# Patient Record
Sex: Female | Born: 1960 | Race: Black or African American | Hispanic: No | Smoking: Current every day smoker
Health system: Southern US, Community
[De-identification: ages and names within clinical notes are randomized; demographics above are authoritative.]

## PROBLEM LIST (undated history)

## (undated) DIAGNOSIS — F419 Anxiety disorder, unspecified: Secondary | ICD-10-CM

---

## 2001-03-07 ENCOUNTER — Other Ambulatory Visit: Admission: RE | Admit: 2001-03-07 | Discharge: 2001-03-07 | Payer: Self-pay | Admitting: *Deleted

## 2009-04-12 ENCOUNTER — Emergency Department (HOSPITAL_COMMUNITY): Admission: EM | Admit: 2009-04-12 | Discharge: 2009-04-12 | Payer: Self-pay | Admitting: Emergency Medicine

## 2009-07-09 ENCOUNTER — Ambulatory Visit (HOSPITAL_COMMUNITY): Admission: RE | Admit: 2009-07-09 | Discharge: 2009-07-09 | Payer: Self-pay | Admitting: Family Medicine

## 2010-05-19 ENCOUNTER — Ambulatory Visit (HOSPITAL_COMMUNITY): Admission: RE | Admit: 2010-05-19 | Discharge: 2010-05-19 | Payer: Self-pay | Admitting: Internal Medicine

## 2010-12-18 LAB — URINALYSIS, ROUTINE W REFLEX MICROSCOPIC
Bilirubin Urine: NEGATIVE
Glucose, UA: NEGATIVE mg/dL
Hgb urine dipstick: NEGATIVE
Ketones, ur: NEGATIVE mg/dL
Leukocytes, UA: NEGATIVE
Nitrite: POSITIVE — AB
Protein, ur: NEGATIVE mg/dL
Specific Gravity, Urine: 1.02 (ref 1.005–1.030)
Urobilinogen, UA: 0.2 mg/dL (ref 0.0–1.0)
pH: 5.5 (ref 5.0–8.0)

## 2010-12-18 LAB — URINE MICROSCOPIC-ADD ON

## 2010-12-18 LAB — PREGNANCY, URINE: Preg Test, Ur: NEGATIVE

## 2010-12-18 LAB — GLUCOSE, CAPILLARY

## 2012-07-26 ENCOUNTER — Other Ambulatory Visit (HOSPITAL_COMMUNITY): Payer: Self-pay | Admitting: Internal Medicine

## 2012-07-26 DIAGNOSIS — Z139 Encounter for screening, unspecified: Secondary | ICD-10-CM

## 2012-07-29 ENCOUNTER — Ambulatory Visit (HOSPITAL_COMMUNITY): Payer: Self-pay

## 2012-07-30 ENCOUNTER — Ambulatory Visit (HOSPITAL_COMMUNITY)
Admission: RE | Admit: 2012-07-30 | Discharge: 2012-07-30 | Disposition: A | Discharge status: Home / Self Care | Payer: 59 | Source: Ambulatory Visit | Attending: Internal Medicine | Admitting: Internal Medicine

## 2012-07-30 DIAGNOSIS — Z1231 Encounter for screening mammogram for malignant neoplasm of breast: Secondary | ICD-10-CM | POA: Insufficient documentation

## 2012-07-30 DIAGNOSIS — Z139 Encounter for screening, unspecified: Secondary | ICD-10-CM

## 2013-04-05 ENCOUNTER — Encounter (HOSPITAL_COMMUNITY): Payer: Self-pay | Admitting: *Deleted

## 2013-04-05 ENCOUNTER — Emergency Department (HOSPITAL_COMMUNITY): Payer: Self-pay

## 2013-04-05 ENCOUNTER — Emergency Department (HOSPITAL_COMMUNITY)
Admission: EM | Admit: 2013-04-05 | Discharge: 2013-04-05 | Disposition: A | Discharge status: Home / Self Care | Payer: Self-pay | Attending: Emergency Medicine | Admitting: Emergency Medicine

## 2013-04-05 DIAGNOSIS — F17200 Nicotine dependence, unspecified, uncomplicated: Secondary | ICD-10-CM | POA: Insufficient documentation

## 2013-04-05 DIAGNOSIS — Z8659 Personal history of other mental and behavioral disorders: Secondary | ICD-10-CM | POA: Insufficient documentation

## 2013-04-05 DIAGNOSIS — R0789 Other chest pain: Secondary | ICD-10-CM | POA: Insufficient documentation

## 2013-04-05 DIAGNOSIS — R450 Nervousness: Secondary | ICD-10-CM | POA: Insufficient documentation

## 2013-04-05 HISTORY — DX: Anxiety disorder, unspecified: F41.9

## 2013-04-05 LAB — URINALYSIS, ROUTINE W REFLEX MICROSCOPIC
Bilirubin Urine: NEGATIVE
Glucose, UA: NEGATIVE mg/dL
Protein, ur: NEGATIVE mg/dL
Specific Gravity, Urine: 1.01 (ref 1.005–1.030)
Urobilinogen, UA: 0.2 mg/dL (ref 0.0–1.0)
pH: 6.5 (ref 5.0–8.0)

## 2013-04-05 LAB — URINE MICROSCOPIC-ADD ON

## 2013-04-05 LAB — CBC WITH DIFFERENTIAL/PLATELET
Basophils Relative: 0 % (ref 0–1)
Eosinophils Relative: 1 % (ref 0–5)
Lymphocytes Relative: 42 % (ref 12–46)
MCH: 30.4 pg (ref 26.0–34.0)
MCHC: 33.6 g/dL (ref 30.0–36.0)
Neutrophils Relative %: 49 % (ref 43–77)
RBC: 4.47 MIL/uL (ref 3.87–5.11)

## 2013-04-05 LAB — D-DIMER, QUANTITATIVE: D-Dimer, Quant: <0.27 ug/mL-FEU (ref 0.00–0.48)

## 2013-04-05 LAB — BASIC METABOLIC PANEL
Calcium: 10 mg/dL (ref 8.4–10.5)
Chloride: 103 mEq/L (ref 96–112)
Glucose, Bld: 104 mg/dL — ABNORMAL HIGH (ref 70–99)
Sodium: 140 mEq/L (ref 135–145)

## 2013-04-05 LAB — TROPONIN I: Troponin I: <0.3 ng/mL (ref <0.30)

## 2013-04-05 LAB — LIPASE, BLOOD: Lipase: 22 U/L (ref 11–59)

## 2013-04-05 MED ORDER — ASPIRIN 81 MG PO CHEW
324.0000 mg | CHEWABLE_TABLET | Freq: Once | ORAL | Status: AC
  Administered 2013-04-05: 324 mg via ORAL
  Filled 2013-04-05: qty 4

## 2013-04-05 NOTE — ED Notes (Signed)
Pt c/o mid center chest pain and dizziness that started when she went to stand up, states that she thought that it may have been an anxiety attack but sat back down , took some deep breathes and the pain remained, denies any n/v. Sob,.

## 2013-04-05 NOTE — ED Provider Notes (Signed)
Medical screening examination/treatment/procedure(s) were performed by non-physician practitioner and as supervising physician I was immediately available for consultation/collaboration. Emila Steinhauser, MD, FACEP   Meng Winterton L Ankith Edmonston, MD 04/05/13 2339 

## 2013-04-05 NOTE — ED Provider Notes (Signed)
CSN: [id number]     Arrival date & time 04/05/13  1740 History     First MD Initiated Contact with Patient 04/05/13 1807     Chief Complaint  Patient presents with  . Chest Pain   (Consider location/radiation/quality/duration/timing/severity/associated sxs/prior Treatment) HPI Comments: Patient is a 52 year old African American female who presents to the emergency department with a complaint of heaviness and fullness in the anterior chest. The patient states that during this evening she had been" rolling up and preparing her hair", she got up from her sitting position to go into another bromide she was stopped in her tracks because of a for cessation in her chest. This was accompanied by a sensation of her heart beating very fast and dizziness with a sensation of the room spinning. The patient is not sure about being short of breath. She states she did not lose consciousness. She took some deep breaths and the pain got a little better but did not go completely away. Patient states about time she arrived at the emergency department however the pain has nearly all gone. It is of note that the patient has had problems with anxiety in the past, the patient became concerned because she states that when she sat down for a few minutes the anxiousness did not go away.  It is also of note that the patient is a smoker. She uses alcohol occasionally. She's not had any injury or trauma to her chest. She's never been treated diagnosed with any blood clots in the legs or lungs. She's not had any injury or operations to the chest wall.  The history is provided by the patient.    Past Medical History  Diagnosis Date  . Anxiety    History reviewed. No pertinent past surgical history. No family history on file. History  Substance Use Topics  . Smoking status: Current Every Day Smoker  . Smokeless tobacco: Not on file  . Alcohol Use: Not on file   OB History   Grav Para Term Preterm Abortions TAB SAB Ect  Mult Living                 Review of Systems  Constitutional: Negative for activity change.       All ROS Neg except as noted in HPI  HENT: Negative for nosebleeds and neck pain.   Eyes: Negative for photophobia and discharge.  Respiratory: Negative for cough, shortness of breath and wheezing.   Cardiovascular: Negative for chest pain and palpitations.  Gastrointestinal: Negative for abdominal pain and blood in stool.  Genitourinary: Negative for dysuria, frequency and hematuria.  Musculoskeletal: Negative for back pain and arthralgias.  Skin: Negative.   Neurological: Negative for dizziness, seizures and speech difficulty.  Psychiatric/Behavioral: Negative for hallucinations and confusion. The patient is nervous/anxious.     Allergies  Review of patient's allergies indicates no known allergies.  Home Medications  No current outpatient prescriptions on file. BP 130/82  Pulse 90  Temp(Src) 98.3 F (36.8 C)  Resp 16  Ht 5\' 7"  (1.702 m)  Wt 175 lb (79.379 kg)  BMI 27.4 kg/m2  SpO2 100% Physical Exam  Nursing note and vitals reviewed. Constitutional: She is oriented to person, place, and time. She appears well-developed and well-nourished.  Non-toxic appearance.  HENT:  Head: Normocephalic.  Right Ear: Tympanic membrane and external ear normal.  Left Ear: Tympanic membrane and external ear normal.  Eyes: EOM and lids are normal. Pupils are equal, round, and reactive to light.  Neck:  Normal range of motion. Neck supple. Carotid bruit is not present.  Cardiovascular: Normal rate, regular rhythm, normal heart sounds, intact distal pulses and normal pulses.   Pulmonary/Chest: Breath sounds normal. No respiratory distress.  Abdominal: Soft. Bowel sounds are normal. There is no tenderness. There is no guarding.  Musculoskeletal: Normal range of motion.  Lymphadenopathy:       Head (right side): No submandibular adenopathy present.       Head (left side): No submandibular  adenopathy present.    She has no cervical adenopathy.  Neurological: She is alert and oriented to person, place, and time. She has normal strength. No cranial nerve deficit or sensory deficit.  Skin: Skin is warm and dry.  Psychiatric: She has a normal mood and affect. Her speech is normal.    ED Course   Procedures (including critical care time) Pulse oximetry 100% on room. Within normal limits by my interpretation. Labs Reviewed - No data to display No results found. No diagnosis found.  Date: 04/05/2013  Rate: *81**  Rhythm: normal sinus rhythm  QRS Axis: normal  Intervals: normal  ST/T Wave abnormalities: normal  Conduction Disutrbances:none  Narrative Interpretation: No STEMI  Old EKG Reviewed: none available  MDM  *I have reviewed nursing notes, vital signs, and all appropriate lab and imaging results for this patient.** Pt presents to ED with a full sensation and heaviness in  The anterior chest.  Pain last about 30 min, and was accompanied by dizziness (head spinning). Case reviewed with Dr. Lynelle Doctor.  CBC wnl. D-dimer wnl at <0.27.  Chest xray negative for acute problems. Troponin I neg. Times 2. No rhythm changes on monitor. Pt denies any heaviness at discharge. She is eating without problem. Ambulatory to BR without problem.  Pt will see her PCP next week for completion of the workup. She will return to the ED if any changes before her office appointment.   Kathie Dike, PA-C 04/05/13 2143

## 2013-04-05 NOTE — ED Notes (Signed)
Discharge instructions reviewed with pt, questions answered. Pt verbalized understanding.  

## 2013-04-05 NOTE — ED Notes (Signed)
Pt updated on wait status, labs reviewed, pt given crackers and ginger ale per PA

## 2013-04-08 LAB — URINE CULTURE

## 2013-04-11 ENCOUNTER — Telehealth (HOSPITAL_COMMUNITY): Payer: Self-pay | Admitting: *Deleted

## 2013-04-11 NOTE — ED Notes (Signed)
Post ED Visit - Positive Culture Follow-up: Successful Patient Follow-Up  Culture assessed and recommendations reviewed by: []  Wes Dulaney, Pharm.D., BCPS [x]  Celedonio Miyamoto, Pharm.D., BCPS []  Georgina Pillion, Pharm.D., BCPS []  [place], [address].D., BCPS, AAHIVP []  Estella Husk, Pharm.D., BCPS, AAHIVP  Positive urine culture  []  Patient discharged without antimicrobial prescription and treatment is now indicated [x]  Organism is resistant to prescribed ED discharge antimicrobial []  Patient with positive blood cultures  Changes discussed with ED provider:Lisa Allyne Gee New antibiotic prescription  Bactrim po BID x 3 days   Contacted patient: Left voice mail message, date 04/11/2013 [id number]  Larena Sox 04/11/2013, 6:20 PM

## 2013-04-12 ENCOUNTER — Telehealth (HOSPITAL_COMMUNITY): Payer: Self-pay | Admitting: Emergency Medicine

## 2013-04-13 ENCOUNTER — Telehealth (HOSPITAL_COMMUNITY): Payer: Self-pay | Admitting: Emergency Medicine

## 2013-04-13 NOTE — ED Notes (Signed)
Unable to contact patient via phone. Sent letter. °

## 2015-01-04 IMAGING — CR DG CHEST 1V PORT
1 series · 1 of 1 positions shown · non-contrast
Comparison: 07/09/2009

CLINICAL DATA: Shortness of breath, mid chest pain

PORTABLE CHEST - 1 VIEW

[portable]
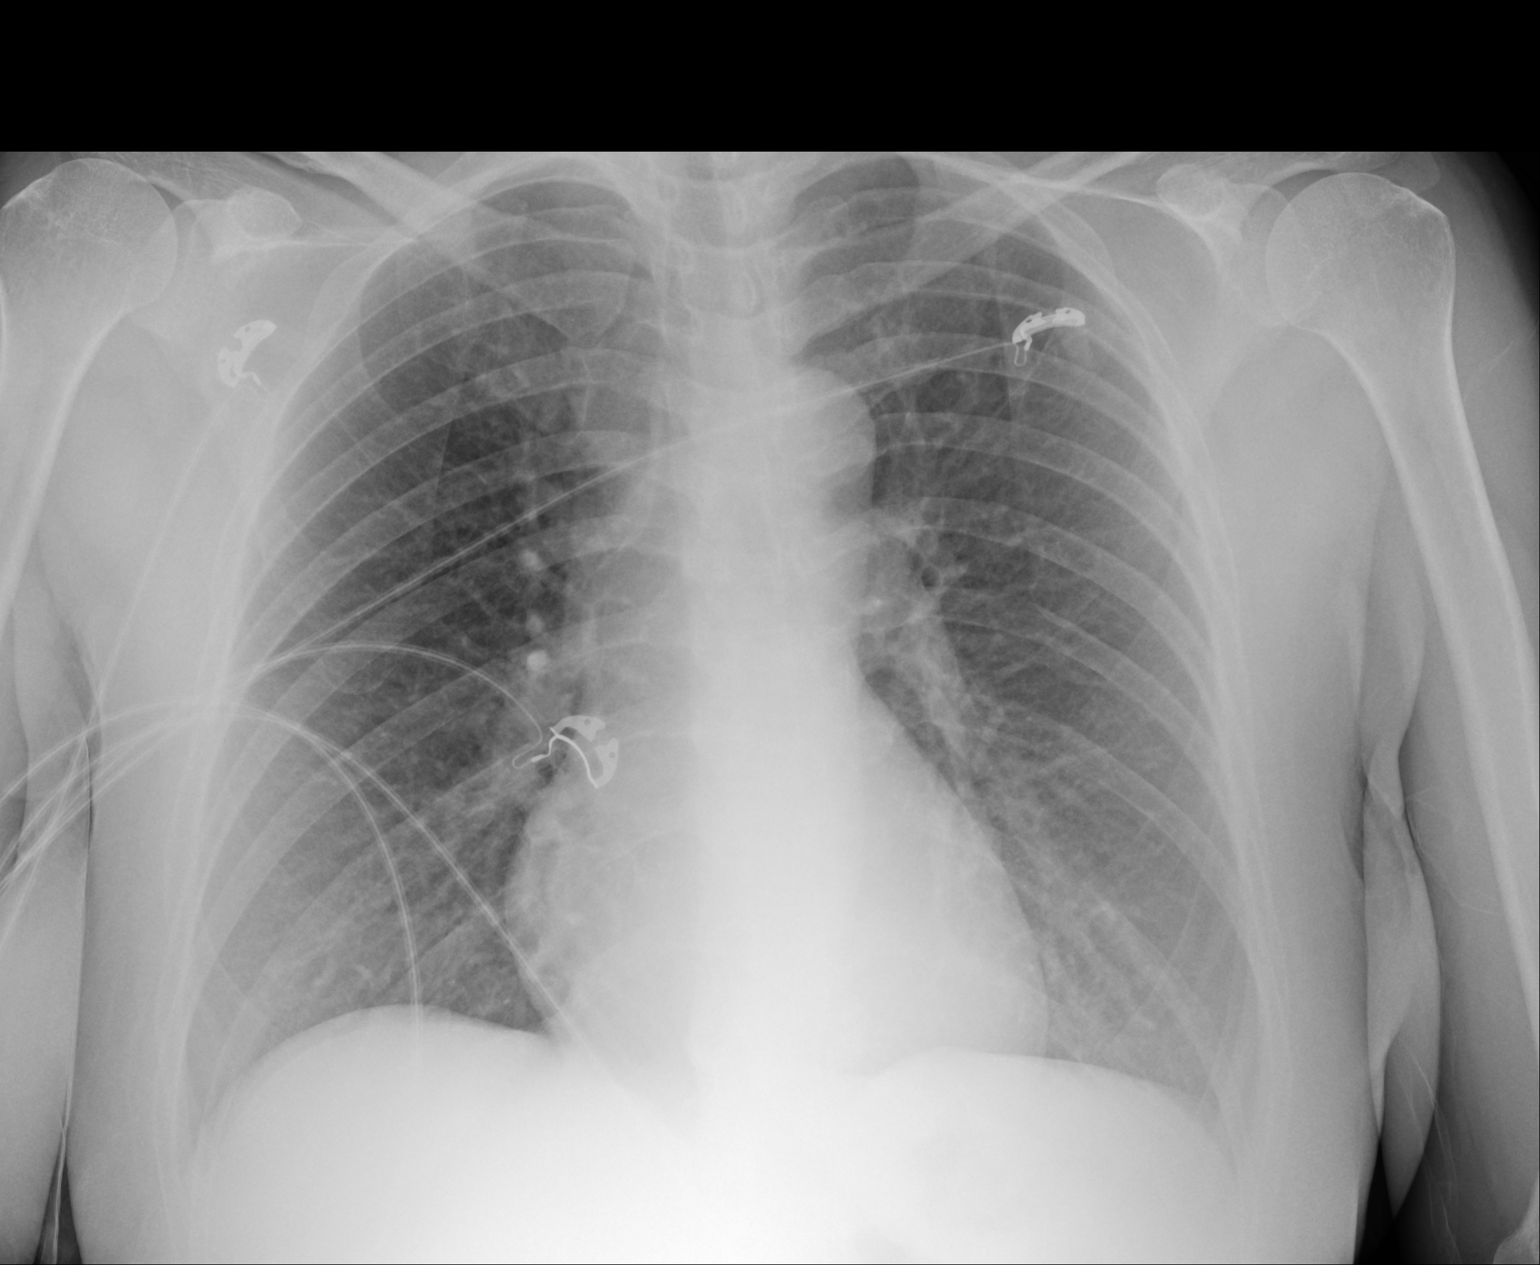

[1 of 1 positions shown; findings below may reference images not displayed]

FINDINGS: Lungs are clear. No pleural effusion or pneumothorax.

Cardiomediastinal silhouette is within normal limits.
IMPRESSION: No evidence of acute cardiopulmonary disease.

## 2021-09-19 ENCOUNTER — Encounter (INDEPENDENT_AMBULATORY_CARE_PROVIDER_SITE_OTHER): Payer: Self-pay | Admitting: *Deleted

## 2021-10-13 ENCOUNTER — Telehealth (INDEPENDENT_AMBULATORY_CARE_PROVIDER_SITE_OTHER): Payer: Self-pay | Admitting: *Deleted

## 2021-10-13 ENCOUNTER — Other Ambulatory Visit (INDEPENDENT_AMBULATORY_CARE_PROVIDER_SITE_OTHER): Payer: Self-pay

## 2021-10-13 ENCOUNTER — Encounter (INDEPENDENT_AMBULATORY_CARE_PROVIDER_SITE_OTHER): Payer: Self-pay | Admitting: *Deleted

## 2021-10-13 DIAGNOSIS — Z1211 Encounter for screening for malignant neoplasm of colon: Secondary | ICD-10-CM

## 2021-10-13 MED ORDER — PEG 3350-KCL-NA BICARB-NACL 420 G PO SOLR: 4000.0000 mL | Freq: Once | ORAL | 0 refills | Status: AC

## 2021-10-13 NOTE — Telephone Encounter (Signed)
Patient needs trilyte 

## 2021-10-13 NOTE — Telephone Encounter (Signed)
Referring MD/PCP: millsaps  Procedure: tcs  Reason/Indication:  screening  Has patient had this procedure before?  no  If so, when, by whom and where?    Is there a family history of colon cancer?  no  Who?  What age when diagnosed?    Is patient diabetic? If yes, Type 1 or Type 2   no      Does patient have prosthetic heart valve or mechanical valve?  no  Do you have a pacemaker/defibrillator?  no  Has patient ever had endocarditis/atrial fibrillation? no  Does patient use oxygen? no  Has patient had joint replacement within last 12 months?  no  Is patient constipated or do they take laxatives? no  Does patient have a history of alcohol/drug use?  no  Have you had a stroke/heart attack last 6 mths? no  Do you take medicine for weight loss?  no  For female patients,: have you had a hysterectomy                       are you post menopausal                       do you still have your menstrual cycle no  Is patient on blood thinner such as Coumadin, Plavix and/or Aspirin? yes  Medications: asa 81 mg daily  Allergies: bactrim  Medication Adjustment per Dr Rehman/Dr Levon Hedger asa 2 days  Procedure date & time: 11/09/21

## 2021-11-03 NOTE — Patient Instructions (Signed)
Autumn Norris  11/03/2021     @PREFPERIOPPHARMACY @   Your procedure is scheduled on  11/09/2021.   Report to 01/09/2022 at  0830  A.M.   Call this number if you have problems the morning of surgery:  [phone]   Remember:  Follow the diet and prep instructions given to you by the office.    Take these medicines the morning of surgery with A SIP OF WATER                                           None     Do not wear jewelry, make-up or nail polish.  Do not wear lotions, powders, or perfumes, or deodorant.  Do not shave 48 hours prior to surgery.  Men may shave face and neck.  Do not bring valuables to the hospital.  [facility] is not responsible for any belongings or valuables.  Contacts, dentures or bridgework may not be worn into surgery.  Leave your suitcase in the car.  After surgery it may be brought to your room.  For patients admitted to the hospital, discharge time will be determined by your treatment team.  Patients discharged the day of surgery will not be allowed to drive home and must have someone with them for 24 hours.    Special instructions:   DO NOT smoke tobacco or vape for 24 hours before your procedure.  Please read over the following fact sheets that you were given. Anesthesia Post-op Instructions and Care and Recovery After Surgery      Colonoscopy, Adult, Care After This sheet gives you information about how to care for yourself after your procedure. Your health care provider may also give you more specific instructions. If you have problems or questions, contact your health care provider. What can I expect after the procedure? After the procedure, it is common to have: A small amount of blood in your stool for 24 hours after the procedure. Some gas. Mild cramping or bloating of your abdomen. Follow these instructions at home: Eating and drinking  Drink enough fluid to keep your urine pale yellow. Follow instructions from your  health care provider about eating or drinking restrictions. Resume your normal diet as instructed by your health care provider. Avoid heavy or fried foods that are hard to digest. Activity Rest as told by your health care provider. Avoid sitting for a long time without moving. Get up to take short walks every 1-2 hours. This is important to improve blood flow and breathing. Ask for help if you feel weak or unsteady. Return to your normal activities as told by your health care provider. Ask your health care provider what activities are safe for you. Managing cramping and bloating  Try walking around when you have cramps or feel bloated. Apply heat to your abdomen as told by your health care provider. Use the heat source that your health care provider recommends, such as a moist heat pack or a heating pad. Place a towel between your skin and the heat source. Leave the heat on for 20-30 minutes. Remove the heat if your skin turns bright red. This is especially important if you are unable to feel pain, heat, or cold. You may have a greater risk of getting burned. General instructions If you were given a sedative during the procedure, it can  affect you for several hours. Do not drive or operate machinery until your health care provider says that it is safe. For the first 24 hours after the procedure: Do not sign important documents. Do not drink alcohol. Do your regular daily activities at a slower pace than normal. Eat soft foods that are easy to digest. Take over-the-counter and prescription medicines only as told by your health care provider. Keep all follow-up visits as told by your health care provider. This is important. Contact a health care provider if: You have blood in your stool 2-3 days after the procedure. Get help right away if you have: More than a small spotting of blood in your stool. Large blood clots in your stool. Swelling of your abdomen. Nausea or vomiting. A  fever. Increasing pain in your abdomen that is not relieved with medicine. Summary After the procedure, it is common to have a small amount of blood in your stool. You may also have mild cramping and bloating of your abdomen. If you were given a sedative during the procedure, it can affect you for several hours. Do not drive or operate machinery until your health care provider says that it is safe. Get help right away if you have a lot of blood in your stool, nausea or vomiting, a fever, or increased pain in your abdomen. This information is not intended to replace advice given to you by your health care provider. Make sure you discuss any questions you have with your health care provider. Document Revised: 07/04/2019 Document Reviewed: 03/24/2019 Elsevier Patient Education  [place] After This sheet gives you information about how to care for yourself after your procedure. Your health care provider may also give you more specific instructions. If you have problems or questions, contact your health care provider. What can I expect after the procedure? After the procedure, it is common to have: Tiredness. Forgetfulness about what happened after the procedure. Impaired judgment for important decisions. Nausea or vomiting. Some difficulty with balance. Follow these instructions at home: For the time period you were told by your health care provider:   Rest as needed. Do not participate in activities where you could fall or become injured. Do not drive or use machinery. Do not drink alcohol. Do not take sleeping pills or medicines that cause drowsiness. Do not make important decisions or sign legal documents. Do not take care of children on your own. Eating and drinking Follow the diet that is recommended by your health care provider. Drink enough fluid to keep your urine pale yellow. If you vomit: Drink water, juice, or soup when you can drink  without vomiting. Make sure you have little or no nausea before eating solid foods. General instructions Have a responsible adult stay with you for the time you are told. It is important to have someone help care for you until you are awake and alert. Take over-the-counter and prescription medicines only as told by your health care provider. If you have sleep apnea, surgery and certain medicines can increase your risk for breathing problems. Follow instructions from your health care provider about wearing your sleep device: Anytime you are sleeping, including during daytime naps. While taking prescription pain medicines, sleeping medicines, or medicines that make you drowsy. Avoid smoking. Keep all follow-up visits as told by your health care provider. This is important. Contact a health care provider if: You keep feeling nauseous or you keep vomiting. You feel light-headed. You are still sleepy  or having trouble with balance after 24 hours. You develop a rash. You have a fever. You have redness or swelling around the IV site. Get help right away if: You have trouble breathing. You have new-onset confusion at home. Summary For several hours after your procedure, you may feel tired. You may also be forgetful and have poor judgment. Have a responsible adult stay with you for the time you are told. It is important to have someone help care for you until you are awake and alert. Rest as told. Do not drive or operate machinery. Do not drink alcohol or take sleeping pills. Get help right away if you have trouble breathing, or if you suddenly become confused. This information is not intended to replace advice given to you by your health care provider. Make sure you discuss any questions you have with your health care provider. Document Revised: 05/13/2020 Document Reviewed: 07/31/2019 Elsevier Patient Education  2022 [employer].

## 2021-11-04 ENCOUNTER — Encounter (HOSPITAL_COMMUNITY)
Admission: RE | Admit: 2021-11-04 | Discharge: 2021-11-04 | Disposition: A | Discharge status: Home / Self Care | Payer: No Typology Code available for payment source | Source: Ambulatory Visit | Attending: Internal Medicine | Admitting: Internal Medicine

## 2021-11-04 ENCOUNTER — Encounter (HOSPITAL_COMMUNITY): Payer: Self-pay

## 2021-11-09 ENCOUNTER — Encounter (INDEPENDENT_AMBULATORY_CARE_PROVIDER_SITE_OTHER): Payer: Self-pay | Admitting: *Deleted

## 2021-11-09 ENCOUNTER — Ambulatory Visit (HOSPITAL_COMMUNITY)
Admission: RE | Admit: 2021-11-09 | Discharge: 2021-11-09 | Disposition: A | Discharge status: Home / Self Care | Payer: No Typology Code available for payment source | Attending: Internal Medicine | Admitting: Internal Medicine

## 2021-11-09 ENCOUNTER — Encounter (HOSPITAL_COMMUNITY)
Admission: RE | Disposition: A | Discharge status: Home / Self Care | Payer: Self-pay | Source: Home / Self Care | Attending: Internal Medicine

## 2021-11-09 ENCOUNTER — Ambulatory Visit (HOSPITAL_COMMUNITY): Payer: No Typology Code available for payment source | Admitting: Anesthesiology

## 2021-11-09 ENCOUNTER — Ambulatory Visit (HOSPITAL_BASED_OUTPATIENT_CLINIC_OR_DEPARTMENT_OTHER): Payer: No Typology Code available for payment source | Admitting: Anesthesiology

## 2021-11-09 DIAGNOSIS — K5730 Diverticulosis of large intestine without perforation or abscess without bleeding: Secondary | ICD-10-CM

## 2021-11-09 DIAGNOSIS — K648 Other hemorrhoids: Secondary | ICD-10-CM

## 2021-11-09 DIAGNOSIS — K644 Residual hemorrhoidal skin tags: Secondary | ICD-10-CM | POA: Insufficient documentation

## 2021-11-09 DIAGNOSIS — F17200 Nicotine dependence, unspecified, uncomplicated: Secondary | ICD-10-CM | POA: Insufficient documentation

## 2021-11-09 DIAGNOSIS — Z1211 Encounter for screening for malignant neoplasm of colon: Secondary | ICD-10-CM | POA: Insufficient documentation

## 2021-11-09 HISTORY — PX: COLONOSCOPY WITH PROPOFOL: SHX5780

## 2021-11-09 LAB — HM COLONOSCOPY

## 2021-11-09 SURGERY — COLONOSCOPY WITH PROPOFOL
Anesthesia: General

## 2021-11-09 MED ORDER — PROPOFOL 500 MG/50ML IV EMUL
INTRAVENOUS | Status: DC | PRN
  Administered 2021-11-09: 150 ug/kg/min via INTRAVENOUS

## 2021-11-09 MED ORDER — PROPOFOL 10 MG/ML IV BOLUS
INTRAVENOUS | Status: DC | PRN
  Administered 2021-11-09: 100 mg via INTRAVENOUS

## 2021-11-09 MED ORDER — LIDOCAINE HCL (CARDIAC) PF 50 MG/5ML IV SOSY
PREFILLED_SYRINGE | INTRAVENOUS | Status: DC | PRN
  Administered 2021-11-09: 50 mg via INTRAVENOUS

## 2021-11-09 MED ORDER — METAMUCIL SMOOTH TEXTURE 58.6 % PO POWD: 1.0000 | Freq: Every day | ORAL | Status: AC

## 2021-11-09 MED ORDER — LACTATED RINGERS IV SOLN: INTRAVENOUS | Status: DC

## 2021-11-09 NOTE — Op Note (Signed)
[facility] ?Patient Name: Autumn Norris ?Procedure Date: 11/09/2021 9:14 AM ?MRN: [id number] ?Date of Birth: [birth year] ?Attending MD: Lionel December , MD ?CSN: [id number] ?Age: 61 ?Admit Type: Outpatient ?Procedure:                Colonoscopy ?Indications:              Screening for colorectal malignant neoplasm ?Providers:                Lionel December, MD, Crystal Page, Durwin Glaze  ?                          Tech, Technician ?Referring MD:             Joelene Millin. Millsaps, NP ?Medicines:                Propofol per Anesthesia ?Complications:            No immediate complications. ?Estimated Blood Loss:     Estimated blood loss: none. ?Procedure:                Pre-Anesthesia Assessment: ?                          - Prior to the procedure, a History and Physical  ?                          was performed, and patient medications and  ?                          allergies were reviewed. The patient's tolerance of  ?                          previous anesthesia was also reviewed. The risks  ?                          and benefits of the procedure and the sedation  ?                          options and risks were discussed with the patient.  ?                          All questions were answered, and informed consent  ?                          was obtained. Prior Anticoagulants: The patient has  ?                          taken no previous anticoagulant or antiplatelet  ?                          agents except for aspirin. ASA Grade Assessment: I  ?                          - A normal, healthy patient. After reviewing the  ?  risks and benefits, the patient was deemed in  ?                          satisfactory condition to undergo the procedure. ?                          After obtaining informed consent, the colonoscope  ?                          was passed under direct vision. Throughout the  ?                          procedure, the patient's blood pressure, pulse, and  ?                           oxygen saturations were monitored continuously. The  ?                          [id number]-HQ190L ([id number]) scope was introduced through  ?                          the anus and advanced to the the cecum, identified  ?                          by appendiceal orifice and ileocecal valve. The  ?                          colonoscopy was performed without difficulty. The  ?                          patient tolerated the procedure well. The quality  ?                          of the bowel preparation was excellent. The  ?                          ileocecal valve, appendiceal orifice, and rectum  ?                          were photographed. ?Scope In: 9:35:27 AM ?Scope Out: 9:48:49 AM ?Scope Withdrawal Time: 0 hours 7 minutes 40 seconds  ?Total Procedure Duration: 0 hours 13 minutes 22 seconds  ?Findings: ?     The perianal and digital rectal examinations were normal. ?     Scattered diverticula were found in the entire colon. ?     External hemorrhoids were found during retroflexion. The hemorrhoids  ?     were small. ?Impression:               - Diverticulosis in the entire examined colon. ?                          - External hemorrhoids. ?                          - No specimens collected. ?Moderate Sedation: ?  Per Anesthesia Care ?Recommendation:           - Patient has a contact number available for  ?                          emergencies. The signs and symptoms of potential  ?                          delayed complications were discussed with the  ?                          patient. Return to normal activities tomorrow.  ?                          Written discharge instructions were provided to the  ?                          patient. ?                          -Metamucil 1 tablespoonful by mouth daily at  ?                          bedtime. ?                          - High fiber diet today. ?                          - Continue present medications. ?                          - Repeat colonoscopy in  10 years. ?Procedure Code(s):        --- Professional --- ?                          [id number], Colonoscopy, flexible; diagnostic, including  ?                          collection of specimen(s) by brushing or washing,  ?                          when performed (separate procedure) ?Diagnosis Code(s):        --- Professional --- ?                          Z12.11, Encounter for screening for malignant  ?                          neoplasm of colon ?                          K64.4, Residual hemorrhoidal skin tags ?                          K57.30, Diverticulosis of large intestine without  ?  perforation or abscess without bleeding ?CPT copyright 2019 American Medical Association. All rights reserved. ?The codes documented in this report are preliminary and upon coder review may  ?be revised to meet current compliance requirements. ?Lionel December, MD ?Lionel December, MD ?11/09/2021 9:57:06 AM ?This report has been signed electronically. ?Number of Addenda: 0 ?

## 2021-11-09 NOTE — Discharge Instructions (Signed)
Resume aspirin as before ?Metamucil 1 heaping tablespoonful or 4 g by mouth daily at bedtime. ?High-fiber diet. ?No driving for 24 hours. ?Next screening exam in 10 years. ?

## 2021-11-09 NOTE — Anesthesia Postprocedure Evaluation (Signed)
Anesthesia Post Note ? ?Patient: MELITA VILLALONA ? ?Procedure(s) Performed: COLONOSCOPY WITH PROPOFOL ? ?Patient location during evaluation: Phase II ?Anesthesia Type: General ?Level of consciousness: awake and alert and oriented ?Pain management: pain level controlled ?Vital Signs Assessment: post-procedure vital signs reviewed and stable ?Respiratory status: spontaneous breathing, nonlabored ventilation and respiratory function stable ?Cardiovascular status: blood pressure returned to baseline and stable ?Postop Assessment: no apparent nausea or vomiting ?Anesthetic complications: no ? ? ?No notable events documented. ? ? ?Last Vitals:  ?Vitals:  ? 11/09/21 0913 11/09/21 0953  ?BP: 127/68 101/60  ?Pulse: 64 79  ?Resp: 20 19  ?Temp: 36.6 ?C 36.6 ?C  ?SpO2: 100% 99%  ?  ?Last Pain:  ?Vitals:  ? 11/09/21 0953  ?TempSrc: Oral  ?PainSc: 0-No pain  ? ? ?  ?  ?  ?  ?  ?  ? ?Sharmane Dame C Tomiko Schoon ? ? ? ? ?

## 2021-11-09 NOTE — Transfer of Care (Signed)
Immediate Anesthesia Transfer of Care Note ? ?Patient: Autumn Norris ? ?Procedure(s) Performed: COLONOSCOPY WITH PROPOFOL ? ?Patient Location: Short Stay ? ?Anesthesia Type:General ? ?Level of Consciousness: awake and patient cooperative ? ?Airway & Oxygen Therapy: Patient Spontanous Breathing ? ?Post-op Assessment: Report given to RN and Post -op Vital signs reviewed and stable ? ?Post vital signs: Reviewed and stable ? ?Last Vitals:  ?Vitals Value Taken Time  ?BP 101/60 11/09/21 0953  ?Temp 36.6 ?C 11/09/21 0953  ?Pulse 79 11/09/21 0953  ?Resp 19 11/09/21 0953  ?SpO2 99 % 11/09/21 0953  ? ? ?Last Pain:  ?Vitals:  ? 11/09/21 0953  ?TempSrc: Oral  ?PainSc: 0-No pain  ?   ? ?Patients Stated Pain Goal: 6 (11/09/21 0913) ? ?Complications: No notable events documented. ?

## 2021-11-09 NOTE — H&P (Signed)
Autumn Norris is an 61 y.o. female.   ?Chief Complaint: Patient is here for colonoscopy. ?HPI: Patient is 61 year old African-American female who is here for screening colonoscopy.  This is patient's first exam.  She denies abdominal pain change in bowel habits or rectal bleeding.  Family history is negative for colon cancer.  She is on low-dose aspirin.  She does not take any prescription medications.  Aspirin is on hold. ? ?Past Medical History:  ?Diagnosis Date  ? Anxiety   ? ? ?No past surgical history on file. ? ?No family history on file. ?Social History:  reports that she has been smoking. She does not have any smokeless tobacco history on file. She reports current alcohol use. She reports that she does not currently use drugs. ? ?Allergies:  ?Allergies  ?Allergen Reactions  ? Bactrim [Sulfamethoxazole-Trimethoprim] Other (See Comments)  ?  Other reaction(s): Severe Headache  ? ? ?Medications Prior to Admission  ?Medication Sig Dispense Refill  ? aspirin EC 81 MG tablet Take 81 mg by mouth daily.    ? ? ?No results found for this or any previous visit (from the past 48 hour(s)). ?No results found. ? ?Review of Systems ? ?Blood pressure 127/68, pulse 64, temperature 97.8 ?F (36.6 ?C), temperature source Oral, resp. rate 20, height 5\' 7"  (1.702 m), weight 78 kg, SpO2 100 %. ?Physical Exam ?HENT:  ?   Mouth/Throat:  ?   Mouth: Mucous membranes are moist.  ?   Pharynx: Oropharynx is clear.  ?Eyes:  ?   General: No scleral icterus. ?   Conjunctiva/sclera: Conjunctivae normal.  ?Cardiovascular:  ?   Rate and Rhythm: Normal rate and regular rhythm.  ?   Heart sounds: Normal heart sounds.  ?  No gallop.  ?Pulmonary:  ?   Effort: Pulmonary effort is normal.  ?   Breath sounds: Normal breath sounds.  ?Abdominal:  ?   General: There is no distension.  ?   Palpations: Abdomen is soft. There is no mass.  ?   Tenderness: There is no abdominal tenderness.  ?Musculoskeletal:     ?   General: No swelling.  ?   Cervical  back: Neck supple.  ?Lymphadenopathy:  ?   Cervical: No cervical adenopathy.  ?Skin: ?   General: Skin is warm and dry.  ?Neurological:  ?   Mental Status: She is alert.  ?  ? ?Assessment/Plan ? ?Average risk screening colonoscopy. ? ?Autumn Laser, MD ?11/09/2021, 9:27 AM ? ? ? ?

## 2021-11-09 NOTE — Anesthesia Preprocedure Evaluation (Signed)
Anesthesia Evaluation  ?Patient identified by MRN, date of birth, ID band ?Patient awake ? ? ? ?Reviewed: ?Allergy & Precautions, NPO status , Patient's Chart, lab work & pertinent test results ? ?Airway ?Mallampati: II ? ?TM Distance: >3 FB ?Neck ROM: Full ? ? ? Dental ? ?(+) Dental Advisory Given, Partial Lower ?  ?Pulmonary ?Current Smoker and Patient abstained from smoking.,  ?  ?Pulmonary exam normal ?breath sounds clear to auscultation ? ? ? ? ? ? Cardiovascular ?negative cardio ROS ?Normal cardiovascular exam ?Rhythm:Regular Rate:Normal ? ? ?  ?Neuro/Psych ?PSYCHIATRIC DISORDERS Anxiety negative neurological ROS ?   ? GI/Hepatic ?negative GI ROS, Neg liver ROS,   ?Endo/Other  ?negative endocrine ROS ? Renal/GU ?negative Renal ROS  ?negative genitourinary ?  ?Musculoskeletal ?negative musculoskeletal ROS ?(+)  ? Abdominal ?  ?Peds ?negative pediatric ROS ?(+)  Hematology ?negative hematology ROS ?(+)   ?Anesthesia Other Findings ? ? Reproductive/Obstetrics ?negative OB ROS ? ?  ? ? ? ? ? ? ? ? ? ? ? ? ? ?  ?  ? ? ? ? ? ? ? ?Anesthesia Physical ?Anesthesia Plan ? ?ASA: 2 ? ?Anesthesia Plan: General  ? ?Post-op Pain Management: Minimal or no pain anticipated  ? ?Induction: Intravenous ? ?PONV Risk Score and Plan: TIVA ? ?Airway Management Planned: Nasal Cannula and Natural Airway ? ?Additional Equipment:  ? ?Intra-op Plan:  ? ?Post-operative Plan:  ? ?Informed Consent: I have reviewed the patients History and Physical, chart, labs and discussed the procedure including the risks, benefits and alternatives for the proposed anesthesia with the patient or authorized representative who has indicated his/her understanding and acceptance.  ? ? ? ?Dental advisory given ? ?Plan Discussed with: CRNA and Surgeon ? ?Anesthesia Plan Comments:   ? ? ? ? ? ?Anesthesia Quick Evaluation ? ?

## 2021-11-15 ENCOUNTER — Encounter (HOSPITAL_COMMUNITY): Payer: Self-pay | Admitting: Internal Medicine

## 2024-04-15 ENCOUNTER — Encounter: Payer: Self-pay | Admitting: Family Medicine

## 2024-04-15 ENCOUNTER — Other Ambulatory Visit: Payer: Self-pay | Admitting: Family Medicine

## 2024-04-15 DIAGNOSIS — Z122 Encounter for screening for malignant neoplasm of respiratory organs: Secondary | ICD-10-CM
